# Patient Record
Sex: Male | Born: 1985 | Race: White | Hispanic: No | Marital: Married | Smoking: Never smoker
Health system: Southern US, Community
[De-identification: ages and names within clinical notes are randomized; demographics above are authoritative.]

## PROBLEM LIST (undated history)

## (undated) DIAGNOSIS — G8929 Other chronic pain: Secondary | ICD-10-CM

## (undated) DIAGNOSIS — M549 Dorsalgia, unspecified: Secondary | ICD-10-CM

---

## 2011-06-16 ENCOUNTER — Emergency Department (INDEPENDENT_AMBULATORY_CARE_PROVIDER_SITE_OTHER): Payer: Worker's Compensation

## 2011-06-16 ENCOUNTER — Encounter: Payer: Self-pay | Admitting: *Deleted

## 2011-06-16 ENCOUNTER — Emergency Department (HOSPITAL_BASED_OUTPATIENT_CLINIC_OR_DEPARTMENT_OTHER)
Admission: EM | Admit: 2011-06-16 | Discharge: 2011-06-16 | Disposition: A | Discharge status: Home / Self Care | Payer: Worker's Compensation | Attending: Emergency Medicine | Admitting: Emergency Medicine

## 2011-06-16 DIAGNOSIS — R51 Headache: Secondary | ICD-10-CM

## 2011-06-16 DIAGNOSIS — W208XXA Other cause of strike by thrown, projected or falling object, initial encounter: Secondary | ICD-10-CM | POA: Insufficient documentation

## 2011-06-16 DIAGNOSIS — Y9269 Other specified industrial and construction area as the place of occurrence of the external cause: Secondary | ICD-10-CM | POA: Insufficient documentation

## 2011-06-16 DIAGNOSIS — W19XXXA Unspecified fall, initial encounter: Secondary | ICD-10-CM

## 2011-06-16 DIAGNOSIS — R079 Chest pain, unspecified: Secondary | ICD-10-CM

## 2011-06-16 DIAGNOSIS — S22009A Unspecified fracture of unspecified thoracic vertebra, initial encounter for closed fracture: Secondary | ICD-10-CM | POA: Insufficient documentation

## 2011-06-16 DIAGNOSIS — S0990XA Unspecified injury of head, initial encounter: Secondary | ICD-10-CM

## 2011-06-16 DIAGNOSIS — S22000A Wedge compression fracture of unspecified thoracic vertebra, initial encounter for closed fracture: Secondary | ICD-10-CM

## 2011-06-16 DIAGNOSIS — M549 Dorsalgia, unspecified: Secondary | ICD-10-CM

## 2011-06-16 DIAGNOSIS — R42 Dizziness and giddiness: Secondary | ICD-10-CM

## 2011-06-16 DIAGNOSIS — X58XXXA Exposure to other specified factors, initial encounter: Secondary | ICD-10-CM

## 2011-06-16 HISTORY — DX: Other chronic pain: G89.29

## 2011-06-16 HISTORY — DX: Dorsalgia, unspecified: M54.9

## 2011-06-16 MED ORDER — IBUPROFEN 600 MG PO TABS: 600.0000 mg | ORAL_TABLET | Freq: Four times a day (QID) | ORAL | Status: AC | PRN

## 2011-06-16 MED ORDER — OXYCODONE-ACETAMINOPHEN 5-325 MG PO TABS: 1.0000 | ORAL_TABLET | ORAL | Status: AC | PRN

## 2011-06-16 MED ORDER — HYDROMORPHONE HCL 1 MG/ML IJ SOLN
1.0000 mg | Freq: Once | INTRAMUSCULAR | Status: AC
  Administered 2011-06-16: 1 mg via INTRAVENOUS

## 2011-06-16 MED ORDER — OXYCODONE-ACETAMINOPHEN 5-325 MG PO TABS
1.0000 | ORAL_TABLET | Freq: Once | ORAL | Status: AC
  Administered 2011-06-16: 1 via ORAL

## 2011-06-16 NOTE — ED Notes (Signed)
EMS reports patient was working at [employer] and a dolly approximately [employer], slowly fell and hit patient in the head.  Patient remembers being knocked to the floor, does not remember how he got up.  Questionable LOC.  Patient is complaining of a dizziness and pain in the thoracic spine, shortness of breath due to pain with inhalation.  Patient is on LSB.

## 2011-06-16 NOTE — ED Provider Notes (Signed)
History     CSN: [id number] Arrival date & time: 06/16/2011  6:59 PM  Chief Complaint  Patient presents with  . Fall    back pain    (Consider location/radiation/quality/duration/timing/severity/associated sxs/prior treatment) Patient is a 25 y.o. male presenting with fall. The history is provided by the patient.  Fall   patient reports he was at work and a dolly fell approximately 10 feet and struck him in the head.  He reports loss of consciousness.  He reports mild headache and dizziness.  He reports pain in his thoracic and lower lumbar spine.  Denies weakness of his upper extremities or lower extremities.  He has no amnesia.  Denies nausea or vomiting.  He denies any confusion at this time.  Denies abdominal pain.  Reports mild chest pain with inspiration.  No shortness of breath at this time.  His pain is severe.  It is worsened by movement.  Not relieved by anything.  This event occurred just prior to arrival he was brought in by EMS c-collar and on a backboard  Past Medical History  Diagnosis Date  . Chronic back pain     lumbar spine    History reviewed. No pertinent past surgical history.  No family history on file.  History  Substance Use Topics  . Smoking status: Never Smoker   . Smokeless tobacco: Current User    Types: Snuff  . Alcohol Use: No      Review of Systems  All other systems reviewed and are negative.    Allergies  Review of patient's allergies indicates no known allergies.  Home Medications   Current Outpatient Rx  Name Route Sig Dispense Refill  . CYCLOBENZAPRINE HCL 10 MG PO TABS Oral Take 10 mg by mouth 3 (three) times daily as needed.        BP 131/71  Pulse 87  Temp(Src) 98.3 F (36.8 C) (Oral)  Resp 18  Ht 5\' 9"  (1.753 m)  Wt 215 lb (97.523 kg)  BMI 31.75 kg/m2  SpO2 100%  Physical Exam  Nursing note and vitals reviewed. Constitutional: He is oriented to person, place, and time. He appears well-developed and  well-nourished.  HENT:  Head: Normocephalic and atraumatic.  Eyes: EOM are normal. Pupils are equal, round, and reactive to light.  Neck: Normal range of motion. Neck supple.       C-spine is nontender.  He is cleared by Nexus criteria  Cardiovascular: Normal rate, regular rhythm, normal heart sounds and intact distal pulses.   Pulmonary/Chest: Effort normal and breath sounds normal. No respiratory distress.  Abdominal: Soft. He exhibits no distension. There is no tenderness.  Musculoskeletal: Normal range of motion.       Tenderness of his distal thoracic spine and proximal lumbar spine without evidence of step-offs.  There is normal strength in his bilateral upper and lower extremities.  His no paresthesias or dysesthesias  Neurological: He is alert and oriented to person, place, and time.  Skin: Skin is warm and dry.  Psychiatric: He has a normal mood and affect. Judgment normal.    ED Course  Procedures (including critical care time)  Labs Reviewed - No data to display Ct Head Wo Contrast  06/16/2011  *RADIOLOGY REPORT*  Clinical Data: Trauma.  Head injury.  Pain.  CT HEAD WITHOUT CONTRAST  Technique:  Contiguous axial images were obtained from the base of the skull through the vertex without contrast.  Comparison: None.  Findings: There is no evidence for acute infarction,  intracranial hemorrhage, mass lesion, hydrocephalus, or extra-axial fluid. There is no atrophy or white matter disease.  Calvarium is intact.  There is no acute sinus or mastoid disease.  IMPRESSION: Negative.  Original Report Authenticated By: Elsie Stain, M.D.   I personally reviewed the patient's x-rays  1. Thoracic compression fracture       MDM  C-spine cleared by Nexus criteria.  CT head negative for acute pathology.  His back pain is related to T11 compression fracture without neurologic deficit.  No indication for CT imaging at this time.  The patient's pain was treated in the emergency department  and controlled in the emergency department.  He was discharged home with followup with sports medicine as this will be nonoperative in nature.  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 Claim.  He has normal strength in his upper or lower extremities.  Discharge home with Percocet        Lyanne Co, MD 06/16/11 2337

## 2011-06-22 ENCOUNTER — Ambulatory Visit: Payer: Worker's Compensation | Admitting: Family Medicine

## 2011-06-23 ENCOUNTER — Ambulatory Visit: Payer: Worker's Compensation | Admitting: Family Medicine

## 2011-07-28 ENCOUNTER — Other Ambulatory Visit: Payer: Self-pay | Admitting: Orthopaedic Surgery

## 2011-07-28 DIAGNOSIS — M25522 Pain in left elbow: Secondary | ICD-10-CM

## 2016-04-27 ENCOUNTER — Other Ambulatory Visit: Payer: Self-pay | Admitting: Nurse Practitioner

## 2016-04-27 LAB — URINALYSIS
BILIRUBIN UA: NEGATIVE
GLUCOSE, UA: NEGATIVE
Ketones, UA: NEGATIVE
Leukocytes, UA: NEGATIVE
Nitrite, UA: NEGATIVE
PH UA: 5.5 (ref 5.0–7.5)
PROTEIN UA: NEGATIVE
Specific Gravity, UA: 1.025 (ref 1.005–1.030)
UUROB: 0.2 mg/dL (ref 0.2–1.0)

## 2018-12-19 ENCOUNTER — Telehealth: Payer: Self-pay | Admitting: Internal Medicine

## 2018-12-19 NOTE — Telephone Encounter (Signed)
LMTCB

## 2018-12-19 NOTE — Telephone Encounter (Signed)
Returned phone call to Ander Slade FNP with [facility], this patient needs a Sleep Study/Consult as well as repeat PFT due to abnormal results. 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 Made FNP aware there would be a delay as we are not able to do sleep studies or PFT's at this time. Number to contact patient. [phone]. She is wanting the consult with Dr. Sherene Sires. Recall placed to have patient in office at next available after Covid-19 restrictions have been lifted. Call made to patient to make aware. Nothing further is needed at this time.

## 2018-12-19 NOTE — Telephone Encounter (Signed)
LMTCB. Please schedule patient a consult appt with Dr. Sherene Sires. Please screen for Covid, and write results in appt notes.

## 2019-02-21 ENCOUNTER — Ambulatory Visit (INDEPENDENT_AMBULATORY_CARE_PROVIDER_SITE_OTHER): Payer: Self-pay

## 2019-02-21 ENCOUNTER — Encounter: Payer: Self-pay | Admitting: *Deleted

## 2019-02-21 ENCOUNTER — Ambulatory Visit (INDEPENDENT_AMBULATORY_CARE_PROVIDER_SITE_OTHER): Payer: 59 | Admitting: Internal Medicine

## 2019-02-21 ENCOUNTER — Encounter: Payer: Self-pay | Admitting: Internal Medicine

## 2019-02-21 DIAGNOSIS — R942 Abnormal results of pulmonary function studies: Secondary | ICD-10-CM

## 2019-02-21 NOTE — Progress Notes (Signed)
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     History of Present Illness  02/21/2019  Pulmonary/ 1st office eval/Amiliana Foutz  Chief Complaint  Patient presents with  . 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   Dyspnea:  Denies: Up and down steps, plays sports with kids no sob at all - aerobic w/o up to an hour on gxt or eliptical s sob no cp  Cough: none Sleep: able to flat on side / 2 pillows SABA use: none   No obvious day to day or daytime variability or assoc excess/ purulent sputum or mucus plugs or hemoptysis or cp or chest tightness, subjective wheeze or overt sinus or hb symptoms.   sleep without nocturnal  or early am exacerbation  of respiratory  c/o's or need for noct saba. Also denies any obvious fluctuation of symptoms with weather or environmental changes or other aggravating or alleviating factors except as outlined above   No unusual exposure hx or h/o childhood pna/ asthma or knowledge of premature birth.  Current Allergies, Complete Past Medical History, Past Surgical History, Family History, and Social History were reviewed in [facility] electronic medical record.  ROS  The following are not active complaints unless bolded Hoarseness, sore throat, dysphagia, dental problems, itching, sneezing,  nasal congestion or discharge of excess mucus or purulent secretions, ear ache,   fever, chills, sweats, unintended wt loss or wt gain, classically pleuritic or exertional cp,  orthopnea pnd or arm/hand swelling  or leg swelling, presyncope, palpitations, abdominal  pain, anorexia, nausea, vomiting, diarrhea  or change in bowel habits or change in bladder habits, change in stools or change in urine, dysuria, hematuria,  rash, arthralgias, visual complaints, headache, numbness, weakness or ataxia or problems with walking or coordination,  change in mood or  memory.           Past Medical History:  Diagnosis Date  . Chronic back pain    lumbar spine    Outpatient Medications Prior to Visit  Medication Sig Dispense Refill  . cyclobenzaprine (FLEXERIL) 10 MG tablet Take 10 mg by mouth 3 (three) times daily as needed.          Objective:     BP 132/68 (BP Location: Left Arm, Cuff Size: Normal)   Pulse 89   Temp 97.8 F (36.6 C) (Oral)   Ht 5\' 9"  (1.753 m)   Wt 259 lb (117.5 kg)   SpO2 98%   BMI 38.25 kg/m   SpO2: 98 %  RA  Pleasant stocky amb wm nad    HEENT: nl dentition, turbinates bilaterally, and oropharynx. Nl external ear canals without cough reflex   NECK :  without JVD/Nodes/TM/ nl carotid upstrokes bilaterally   LUNGS: no acc muscle use,  Nl contour chest which is clear to A and P bilaterally without cough on insp or exp maneuvers   CV:  RRR  no s3 or murmur or increase in P2, and no edema   ABD:  soft and nontender with nl inspiratory  excursion in the supine position. No bruits or organomegaly appreciated, bowel sounds nl  MS:  Nl gait/ ext warm without deformities, calf tenderness, cyanosis or clubbing No obvious joint restrictions   SKIN: warm and dry without lesions    NEURO:  alert, approp, nl sensorium with  no motor or cerebellar deficits apparent.      CXR PA and Lateral:   02/21/2019 :    I personally reviewed images and agree with radiology impression as follows:    Mild bronchial thickening. My impression:  Relatively low lung volumes, coarse markings, ? Hilar enlargement  ? Sarcoid?           Assessment   Abnormal PFTs Reported April 2019 assoc with BMI  38 on 02/21/2019  - 02/21/2019   Walked RA   2 laps @  approx 248ft each @ fast pace  stopped due to  End of study, sats still 98%   - coarse changes on cxr 02/21/2019 > rec hrct  Most likely the pft changes and cxr are due to effects of obesity as his ex sats are so good but sarcoid is also in the ddx here and best bet is f/u with pfts/ hrct   Discussed in detail all the  indications, usual  risks and alternatives  relative to the benefits with patient who agrees to proceed with w/u as outlined.     Total time devoted to counseling  > 50 % of initial 45 min office consultation:  review case with pt/  directly observed portions of ambulatory 02 saturation study/ discussion of options/alternatives/ personally creating written customized instructions  in presence of pt  then going over those specific  Instructions directly with the pt including how to use all of the meds but in particular covering each new medication in detail and the difference between the maintenance= "automatic" meds and the prns using an action plan format for the latter (If this problem/symptom => do that organization reading Left to right).  Please see AVS from this visit for a full list of these instructions which I personally wrote for this pt and  are unique to this visit.          Christinia Gully, MD 02/21/2019

## 2019-02-21 NOTE — Patient Instructions (Addendum)
The only abnormality you appear to have is a lung volume problem related to your weight which should not interfere with your breathing but may result in a poor test.  It is my opinion that you  should be able to able to wear a respirator fine without any work restrictions under all DOT regulations or requirements.   Weight control is simply a matter of calorie balance which needs to be tilted in your favor by eating less and exercising more.  To get the most out of exercise, you need to be continuously aware that you are short of breath, but never out of breath, for 30 minutes daily. As you improve, it will actually be easier for you to do the same amount of exercise  in  30 minutes so always push to the level where you are short of breath.    Think of your calorie balance like you do your bank account where in this case you want the balance to go down so you must take in less calories than you burn up.  It's just that simple:  Hard to do, but easy to understand.  Good luck!    Please remember to go to the  x-ray department  for your tests - we will call you with the results when they are available     Pulmonary follow up is as needed Add:  Needs HRCT chest on return for pfts

## 2019-02-22 ENCOUNTER — Encounter: Payer: Self-pay | Admitting: Internal Medicine

## 2019-02-22 ENCOUNTER — Other Ambulatory Visit: Payer: Self-pay | Admitting: Internal Medicine

## 2019-02-22 DIAGNOSIS — R942 Abnormal results of pulmonary function studies: Secondary | ICD-10-CM

## 2019-02-22 NOTE — Assessment & Plan Note (Addendum)
Reported April 2019 assoc with BMI  38 on 02/21/2019  - 02/21/2019   Walked RA  2 laps @  approx 229ft each @ fast pace  stopped due to  End of study, sats still 98%   - coarse changes on cxr 02/21/2019 > rec hrct  Most likely the pft changes and cxr are due to effects of obesity as his ex sats are so good but sarcoid is also in the ddx here and best bet is f/u with pfts/ hrct   Discussed in detail all the  indications, usual  risks and alternatives  relative to the benefits with patient who agrees to proceed with w/u as outlined.     Total time devoted to counseling  > 50 % of initial 45 min office consultation:  review case with pt/  directly observed portions of ambulatory 02 saturation study/ discussion of options/alternatives/ personally creating written customized instructions  in presence of pt  then going over those specific  Instructions directly with the pt including how to use all of the meds but in particular covering each new medication in detail and the difference between the maintenance= "automatic" meds and the prns using an action plan format for the latter (If this problem/symptom => do that organization reading Left to right).  Please see AVS from this visit for a full list of these instructions which I personally wrote for this pt and  are unique to this visit.

## 2019-02-22 NOTE — Progress Notes (Signed)
High res ct ordered

## 2019-03-08 ENCOUNTER — Inpatient Hospital Stay: Admission: RE | Admit: 2019-03-08 | Payer: 59 | Source: Ambulatory Visit

## 2021-01-19 IMAGING — DX CHEST - 2 VIEW
2 series · 2 of 2 positions shown · non-contrast
Comparison: Radiograph 06/16/2011

CLINICAL DATA: Shortness of breath.

EXAM:
CHEST - 2 VIEW

[chest pa]
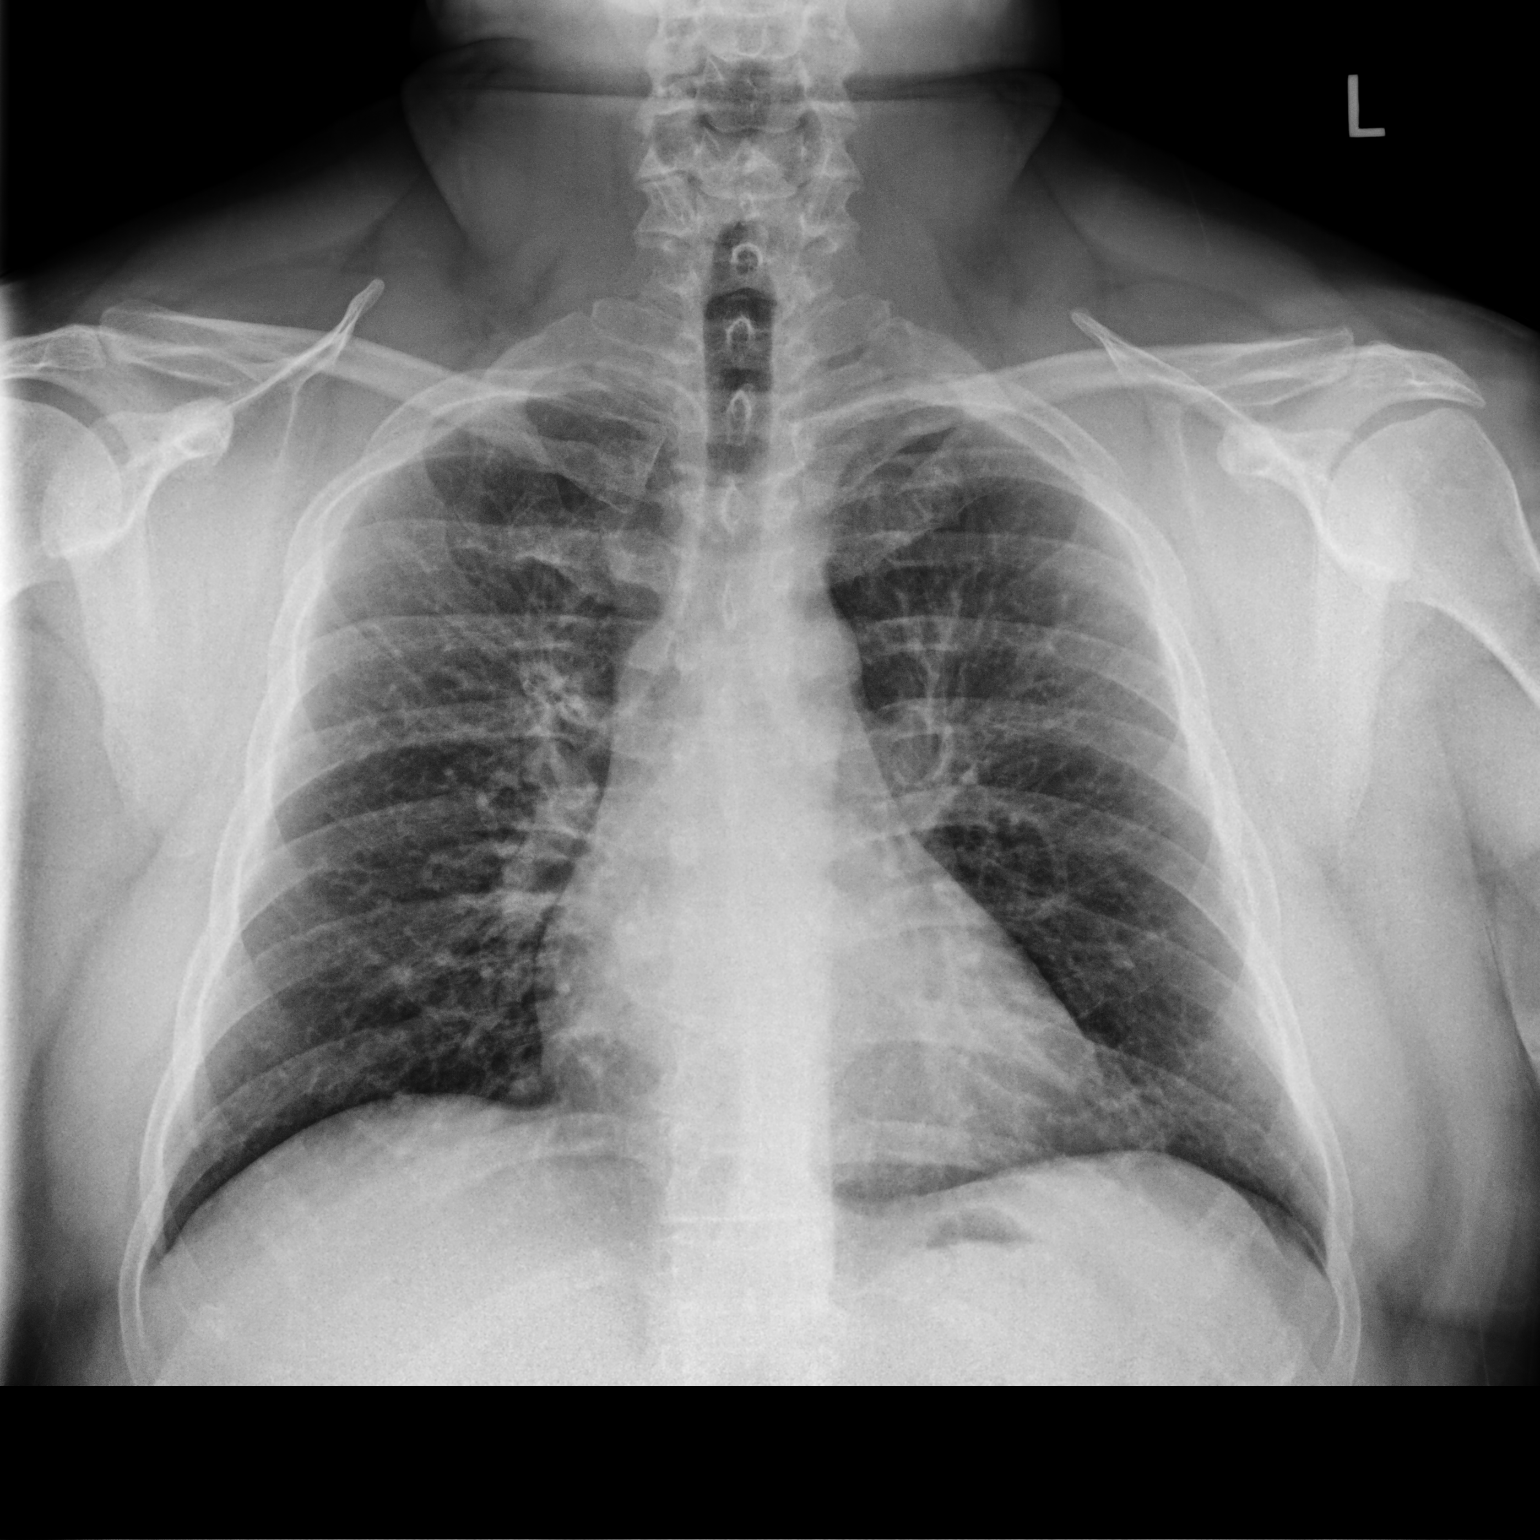

[chest lat]
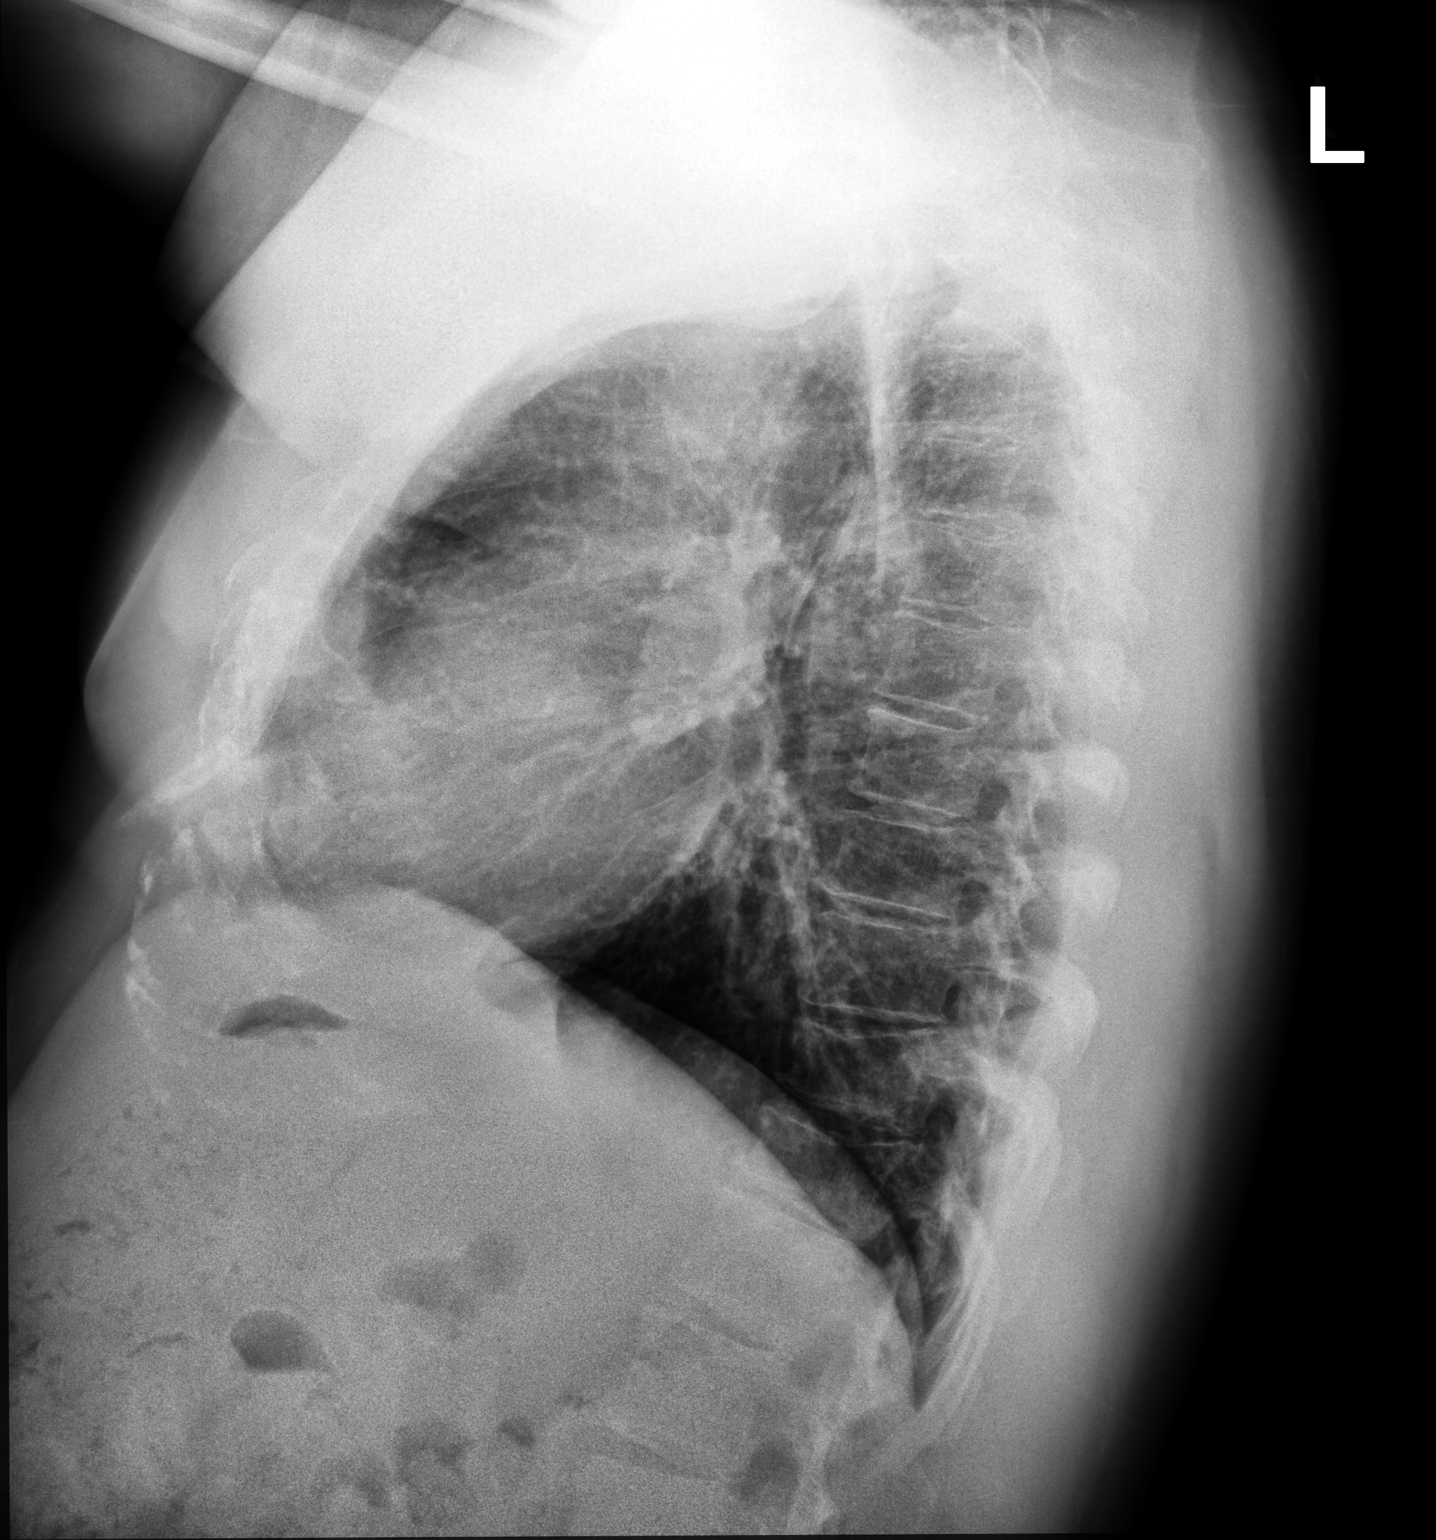

[2 of 2 positions shown; findings below may reference images not displayed]

FINDINGS: The cardiomediastinal contours are normal. Mild bronchial
thickening. Pulmonary vasculature is normal. No consolidation,
pleural effusion, or pneumothorax. No acute osseous abnormalities
are seen. Chronic compression fractures of lower thoracic vertebra.
IMPRESSION: Mild bronchial thickening.
